# Patient Record
Sex: Female | Born: 1980 | Race: White | Hispanic: No | Smoking: Former smoker
Health system: Southern US, Community
[De-identification: ages and names within clinical notes are randomized; demographics above are authoritative.]

## PROBLEM LIST (undated history)

## (undated) HISTORY — PX: TONSILLECTOMY AND ADENOIDECTOMY: SHX28

---

## 2001-03-05 ENCOUNTER — Encounter: Payer: Self-pay | Admitting: Family Medicine

## 2001-03-05 ENCOUNTER — Encounter: Admission: RE | Admit: 2001-03-05 | Discharge: 2001-03-05 | Payer: Self-pay | Admitting: Family Medicine

## 2011-03-23 ENCOUNTER — Other Ambulatory Visit: Payer: Self-pay | Admitting: Occupational Medicine

## 2011-03-23 ENCOUNTER — Ambulatory Visit: Payer: Self-pay

## 2011-03-23 DIAGNOSIS — R52 Pain, unspecified: Secondary | ICD-10-CM

## 2012-07-31 IMAGING — CR DG ELBOW COMPLETE 3+V*L*
4 series · 4 of 4 positions shown · non-contrast
Comparison: None.

CLINICAL DATA: 29-year-old female status post trauma with pain.

LEFT ELBOW - COMPLETE 3+ VIEW

[view not recorded (1 of 4)]
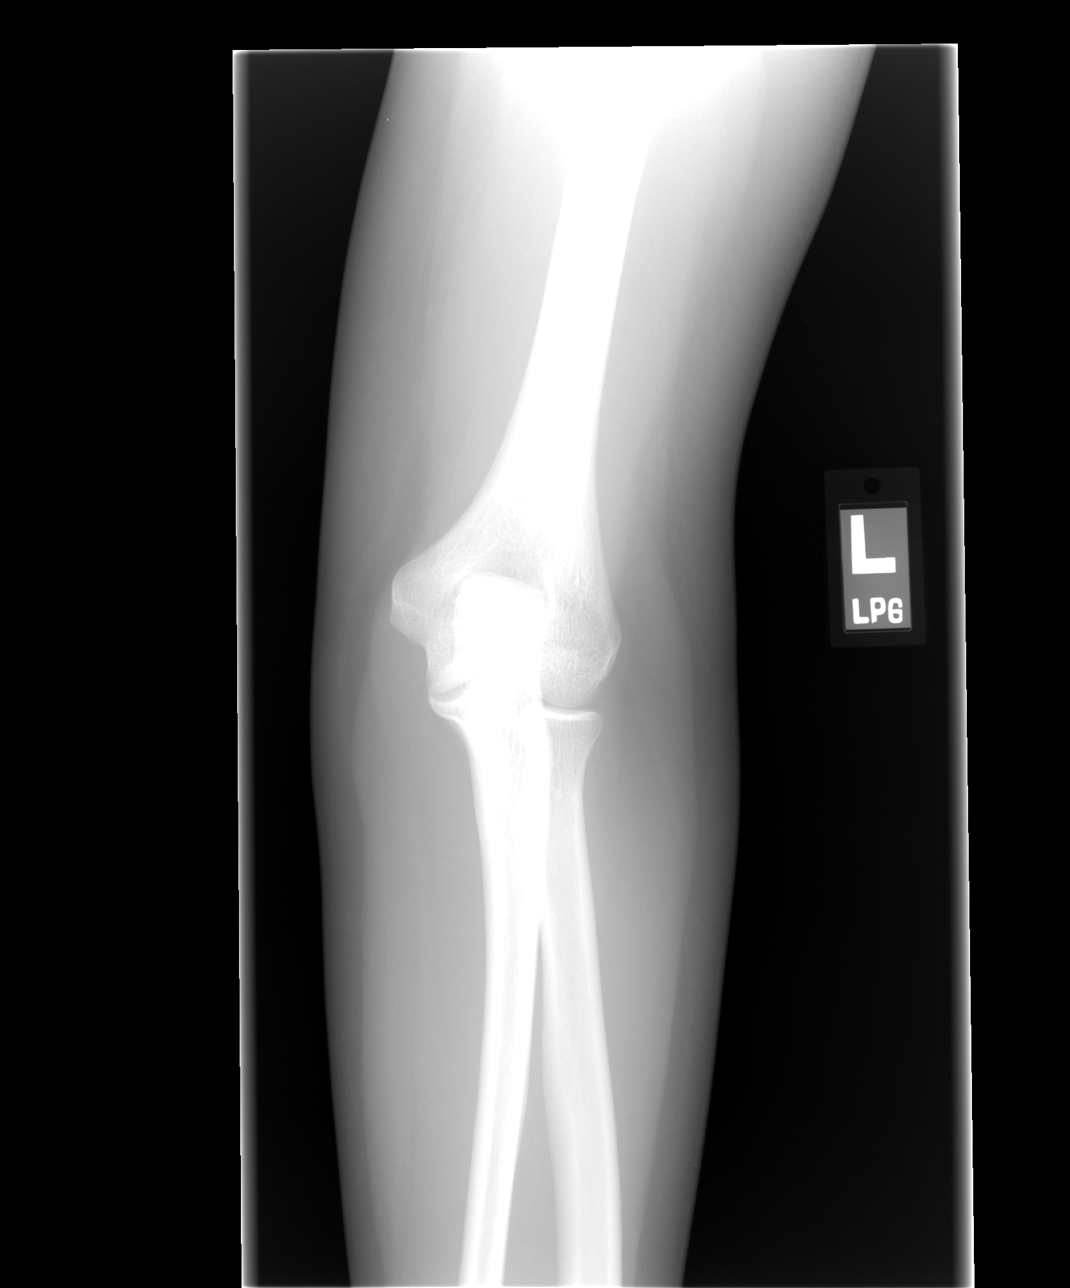

[view not recorded (2 of 4)]
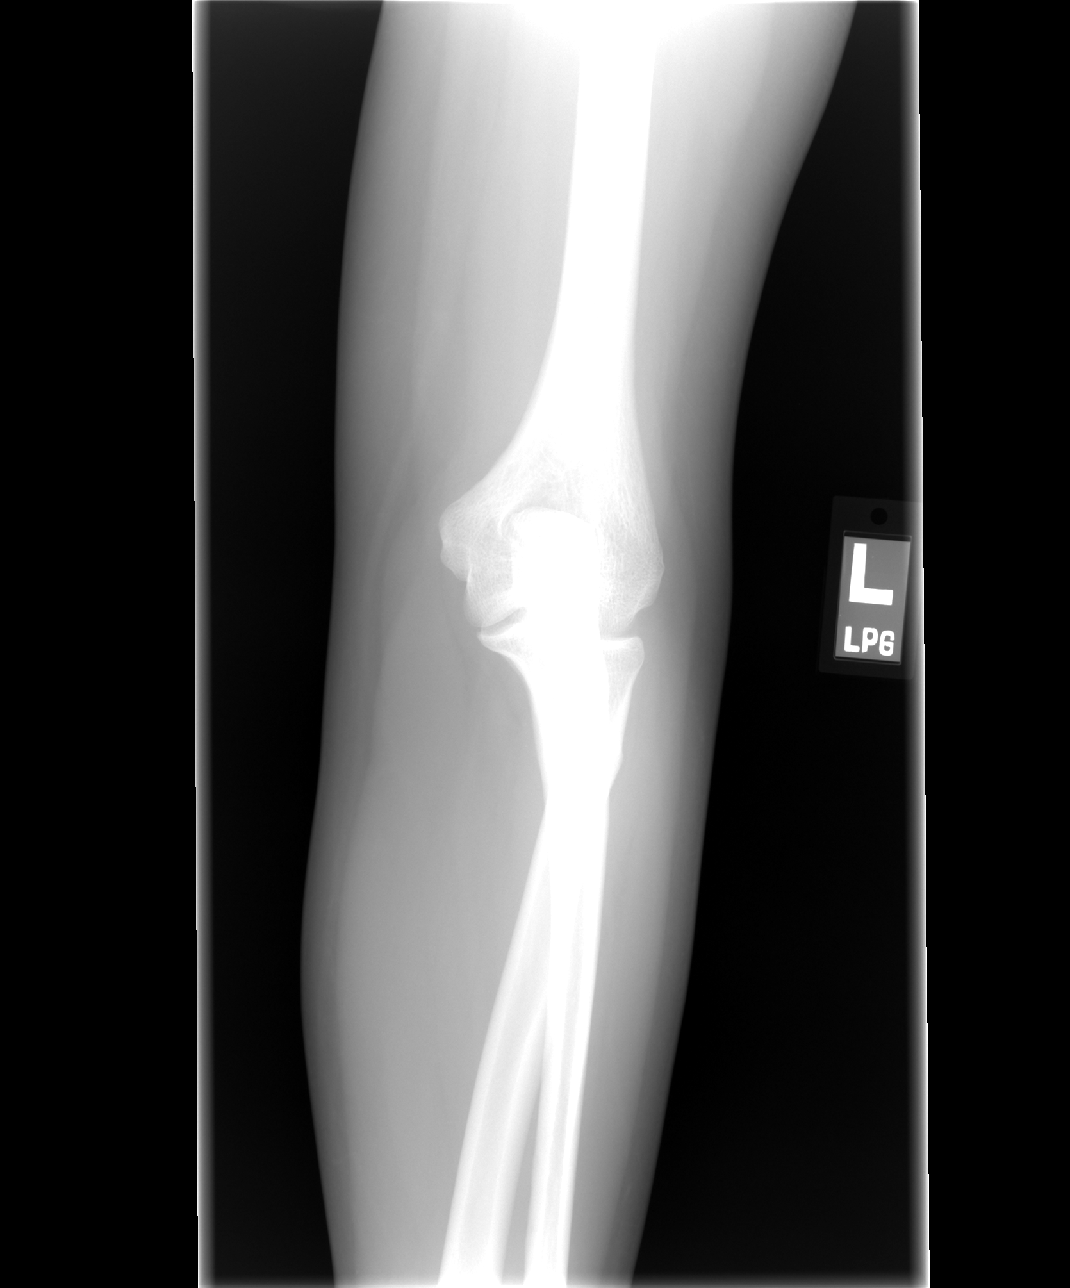

[view not recorded (3 of 4)]
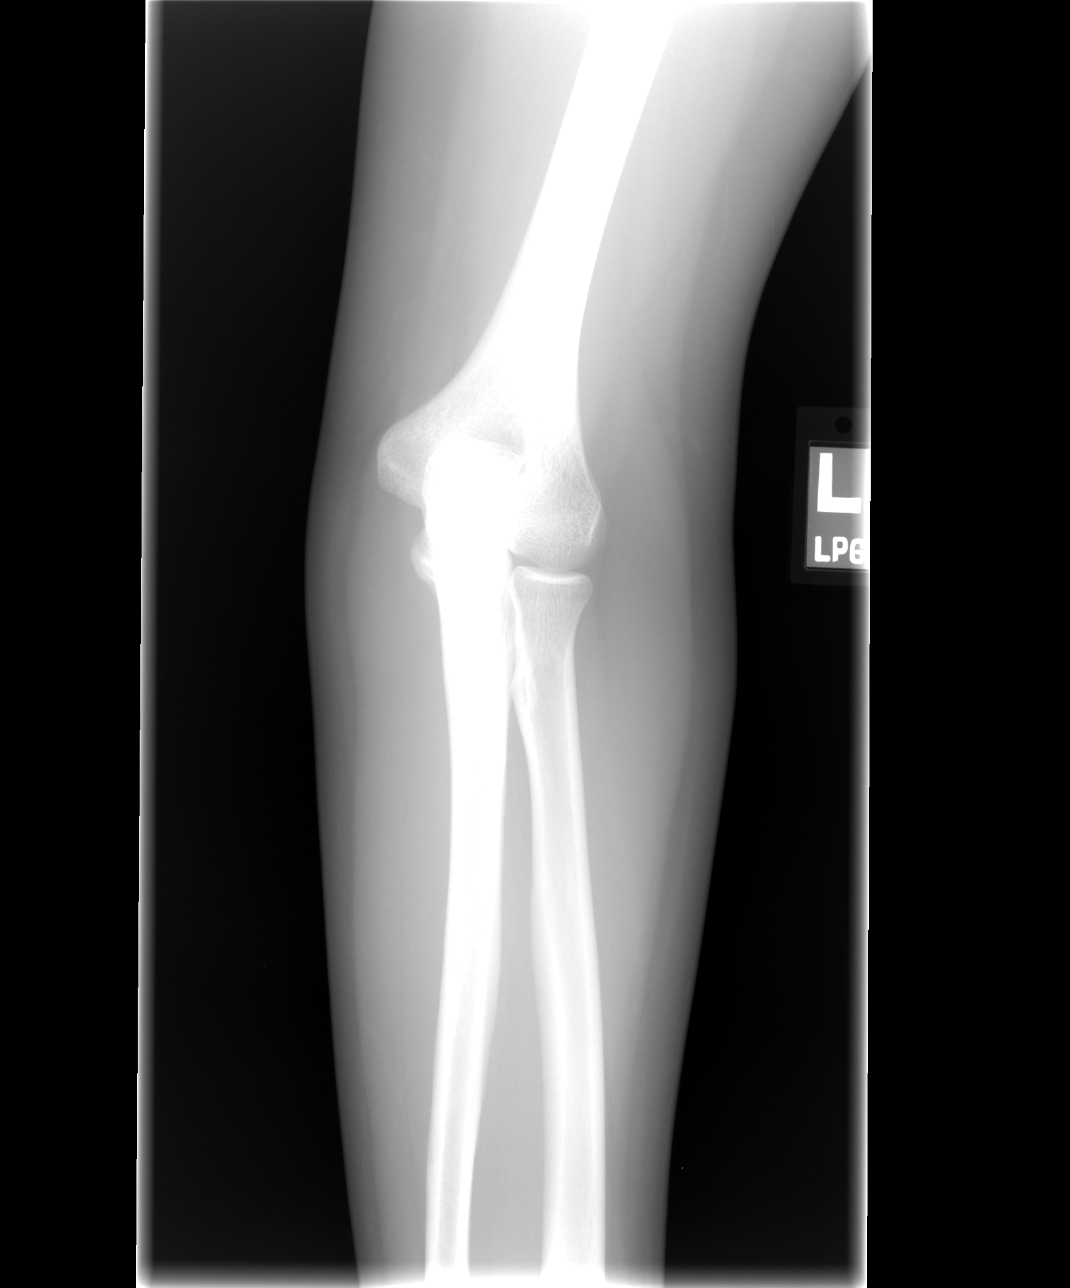

[view not recorded (4 of 4)]
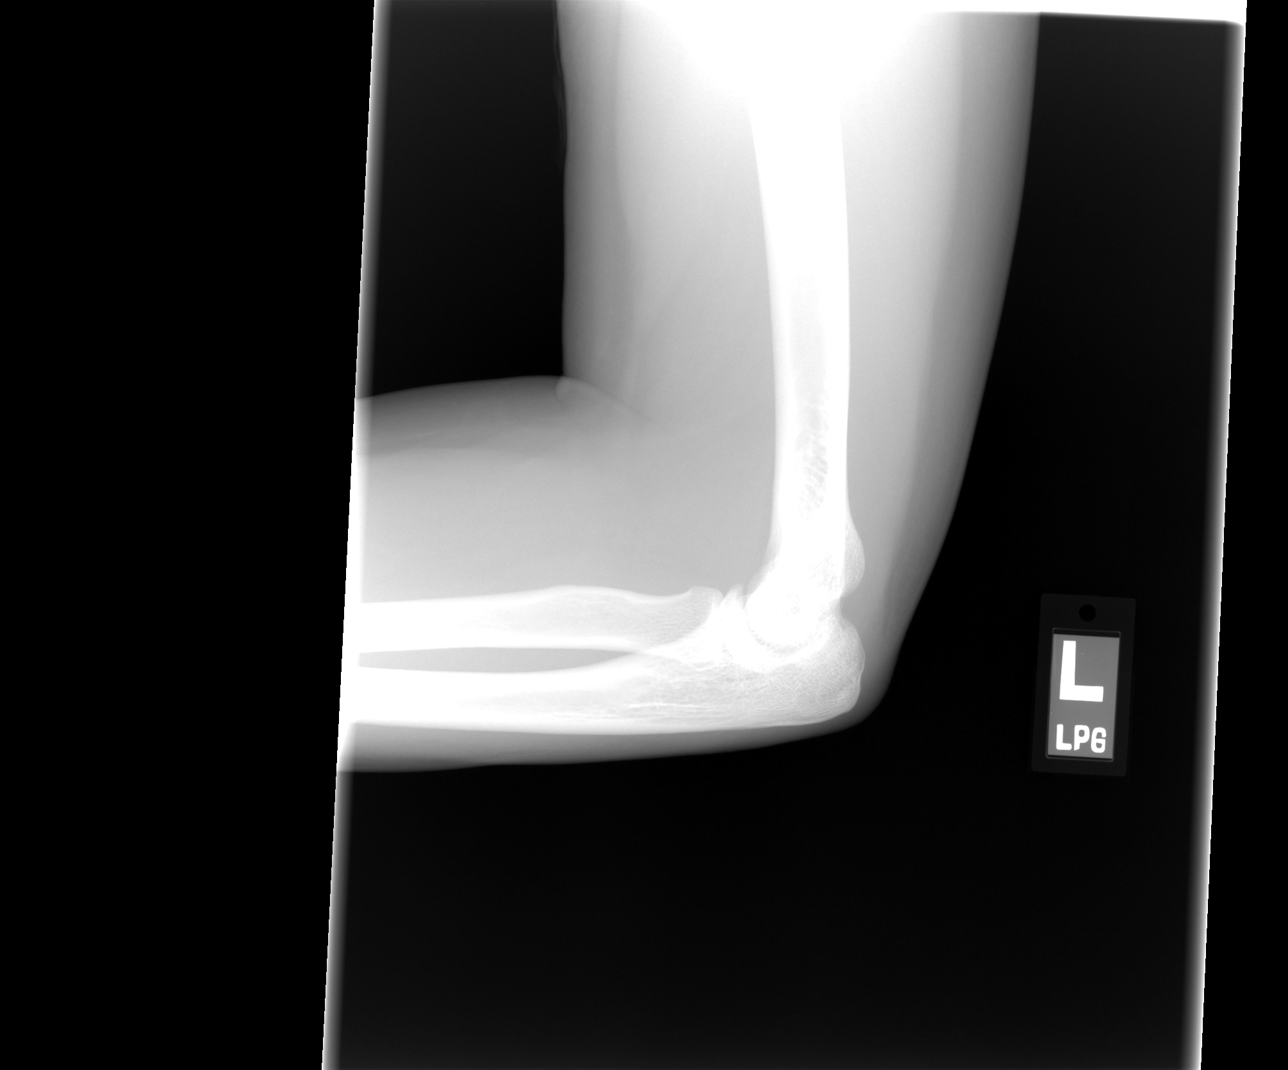

[4 of 4 positions shown; findings below may reference images not displayed]

FINDINGS: No evidence of joint effusion. Bone mineralization is
within normal limits.  Normal alignment and joint spaces.  Radial
head within normal limits.  No acute fracture.
IMPRESSION: No acute fracture or dislocation identified about the left elbow.

## 2014-01-21 ENCOUNTER — Ambulatory Visit: Payer: Self-pay | Admitting: Physician Assistant

## 2014-01-21 VITALS — BP 110/62 | HR 58 | Temp 98.0°F | Resp 18 | Ht 64.0 in | Wt 200.0 lb

## 2014-01-21 DIAGNOSIS — Z0289 Encounter for other administrative examinations: Secondary | ICD-10-CM

## 2014-01-21 NOTE — Progress Notes (Signed)
ua-sp gr-1.010,protein-neg, glucose-neg, blood-neg

## 2014-01-21 NOTE — Progress Notes (Signed)
   Subjective:    Patient ID: Marilyn Webb, female    DOB: 1981/10/06, 33 y.o.   MRN: [id number]  HPI   Ms. 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  She reports that she is in good health.  Denies medical problems.  Denies medication use - prescription or otc.  No recent surgeries.  No seizure or syncope.  She does wear corrective lenses when driving.  Review of Systems  Constitutional: Negative.   HENT: Negative.   Respiratory: Negative.   Cardiovascular: Negative.   Musculoskeletal: Negative.        Objective:   Physical Exam  Vitals reviewed. Constitutional: She is oriented to person, place, and time. She appears well-developed and well-nourished. No distress.  HENT:  Head: Normocephalic and atraumatic.  Right Ear: Tympanic membrane and ear canal normal.  Left Ear: Tympanic membrane and ear canal normal.  Mouth/Throat: Uvula is midline, oropharynx is clear and moist and mucous membranes are normal.  Eyes: Conjunctivae and EOM are normal. Pupils are equal, round, and reactive to light. No scleral icterus.  Neck: Normal range of motion. Neck supple.  Cardiovascular: Normal rate, regular rhythm, normal heart sounds and intact distal pulses.   No murmur heard. Pulmonary/Chest: Effort normal and breath sounds normal. She has no wheezes. She has no rales.  Abdominal: Soft. Bowel sounds are normal. There is no tenderness.  Musculoskeletal: Normal range of motion. She exhibits no edema.  Lymphadenopathy:    She has no cervical adenopathy.  Neurological: She is alert and oriented to person, place, and time. She has normal reflexes.  Skin: Skin is warm and dry.  Psychiatric: She has a normal mood and affect. Her behavior is normal.        Assessment & Plan:  Health examination of defined subpopulation   Ms. 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  She appears to be in good health and exam is normal.  She does wear glasses.  Ok to certify for 2  yrs.  Card and ppw completed  RTC as needs arise.    Loleta DickerE. Elizabeth Eliot Bencivenga MHS, PA-C [facility] 3/12/20153:26 PM

## 2024-03-02 ENCOUNTER — Emergency Department (HOSPITAL_BASED_OUTPATIENT_CLINIC_OR_DEPARTMENT_OTHER)

## 2024-03-02 ENCOUNTER — Encounter (HOSPITAL_BASED_OUTPATIENT_CLINIC_OR_DEPARTMENT_OTHER): Payer: Self-pay | Admitting: Emergency Medicine

## 2024-03-02 ENCOUNTER — Other Ambulatory Visit: Payer: Self-pay

## 2024-03-02 ENCOUNTER — Emergency Department (HOSPITAL_BASED_OUTPATIENT_CLINIC_OR_DEPARTMENT_OTHER)
Admission: EM | Admit: 2024-03-02 | Discharge: 2024-03-02 | Disposition: A | Discharge status: Home / Self Care | Attending: Emergency Medicine | Admitting: Emergency Medicine

## 2024-03-02 DIAGNOSIS — M542 Cervicalgia: Secondary | ICD-10-CM | POA: Insufficient documentation

## 2024-03-02 DIAGNOSIS — D72829 Elevated white blood cell count, unspecified: Secondary | ICD-10-CM | POA: Insufficient documentation

## 2024-03-02 DIAGNOSIS — R519 Headache, unspecified: Secondary | ICD-10-CM | POA: Insufficient documentation

## 2024-03-02 LAB — CBC
HCT: 43.9 % (ref 36.0–46.0)
Hemoglobin: 15.1 g/dL — ABNORMAL HIGH (ref 12.0–15.0)
MCH: 30.3 pg (ref 26.0–34.0)
MCHC: 34.4 g/dL (ref 30.0–36.0)
MCV: 88 fL (ref 80.0–100.0)
Platelets: 391 10*3/uL (ref 150–400)
RBC: 4.99 MIL/uL (ref 3.87–5.11)
RDW: 13.2 % (ref 11.5–15.5)
WBC: 13.2 10*3/uL — ABNORMAL HIGH (ref 4.0–10.5)
nRBC: 0 % (ref 0.0–0.2)

## 2024-03-02 LAB — PREGNANCY, URINE: Preg Test, Ur: NEGATIVE

## 2024-03-02 LAB — BASIC METABOLIC PANEL WITH GFR
Anion gap: 9 (ref 5–15)
BUN: 13 mg/dL (ref 6–20)
CO2: 28 mmol/L (ref 22–32)
Calcium: 9.2 mg/dL (ref 8.9–10.3)
Chloride: 101 mmol/L (ref 98–111)
Creatinine, Ser: 0.75 mg/dL (ref 0.44–1.00)
GFR, Estimated: >60 mL/min (ref >60)
Glucose, Bld: 105 mg/dL — ABNORMAL HIGH (ref 70–99)
Potassium: 3.8 mmol/L (ref 3.5–5.1)
Sodium: 138 mmol/L (ref 135–145)

## 2024-03-02 MED ORDER — DIPHENHYDRAMINE HCL 50 MG/ML IJ SOLN
12.5000 mg | Freq: Once | INTRAMUSCULAR | Status: AC
  Administered 2024-03-02: 12.5 mg via INTRAVENOUS
  Filled 2024-03-02: qty 1

## 2024-03-02 MED ORDER — KETOROLAC TROMETHAMINE 15 MG/ML IJ SOLN
15.0000 mg | Freq: Once | INTRAMUSCULAR | Status: AC
  Administered 2024-03-02: 15 mg via INTRAVENOUS
  Filled 2024-03-02: qty 1

## 2024-03-02 MED ORDER — PROCHLORPERAZINE EDISYLATE 10 MG/2ML IJ SOLN
10.0000 mg | Freq: Once | INTRAMUSCULAR | Status: AC
  Administered 2024-03-02: 10 mg via INTRAVENOUS
  Filled 2024-03-02: qty 2

## 2024-03-02 NOTE — ED Triage Notes (Signed)
 Persistent Neck pain radiating to base of her head x 3 weeks , steroids and muscle relaxer with no relief , CT of neck done last week with no remarkable results . Intact ROM .

## 2024-03-02 NOTE — Discharge Instructions (Signed)
 Your workup was overall reassuring today.  Please follow-up with primary care doctor.  Did also recommend calling your neurologist to schedule follow-up for today's visit.  Return to ER with new or worsening symptoms.

## 2024-03-02 NOTE — ED Provider Notes (Signed)
  EMERGENCY DEPARTMENT AT [facility] Provider Note   CSN: [id number] Arrival date & time: 03/02/24  1148     History  Chief Complaint  Patient presents with   Neck Pain    Marilyn Webb is a 43 y.o. female. With past medical history of IIH, headaches reporting to ER after 2 weeks of neck pain. Patient reports that she tried steroids, muscle relaxer and pain medication and this has helped by now she has posterior HA that will not go away. Headache seems to be better laying down flat. Seems to get worse throughout the day. She recently have CT cervical spine without acute findings, but she reports it is more neck pain.  NO fever, chills. NO URI like symptoms. No injury or fall. No neck rigidity.     Neck Pain Associated symptoms: headaches        Home Medications Prior to Admission medications   Not on File      Allergies    Patient has no known allergies.    Review of Systems   Review of Systems  Neurological:  Positive for headaches.    Physical Exam Updated Vital Signs BP 130/79   Pulse 83   Temp 97.8 F (36.6 C) (Oral)   Resp 18   Wt 106.6 kg   SpO2 100%  Physical Exam Vitals and nursing note reviewed.  Constitutional:      General: She is not in acute distress.    Appearance: She is not toxic-appearing.  HENT:     Head: Normocephalic and atraumatic.  Eyes:     General: No scleral icterus.    Conjunctiva/sclera: Conjunctivae normal.  Cardiovascular:     Rate and Rhythm: Normal rate and regular rhythm.     Pulses: Normal pulses.     Heart sounds: Normal heart sounds.  Pulmonary:     Effort: Pulmonary effort is normal. No respiratory distress.     Breath sounds: Normal breath sounds.  Abdominal:     General: Abdomen is flat. Bowel sounds are normal.     Palpations: Abdomen is soft.     Tenderness: There is no abdominal tenderness.  Musculoskeletal:     Right lower leg: No edema.     Left lower leg: No edema.  Skin:     General: Skin is warm and dry.     Findings: No lesion.  Neurological:     General: No focal deficit present.     Mental Status: She is alert and oriented to person, place, and time. Mental status is at baseline.     ED Results / Procedures / Treatments   Labs (all labs ordered are listed, but only abnormal results are displayed) Labs Reviewed  CBC - Abnormal; Notable for the following components:      Result Value   WBC 13.2 (*)    Hemoglobin 15.1 (*)    All other components within normal limits  BASIC METABOLIC PANEL WITH GFR - Abnormal; Notable for the following components:   Glucose, Bld 105 (*)    All other components within normal limits  PREGNANCY, URINE    EKG None  Radiology No results found.  Procedures Procedures    Medications Ordered in ED Medications - No data to display  ED Course/ Medical Decision Making/ A&P                                 Medical  Decision Making Amount and/or Complexity of Data Reviewed Labs: ordered. Radiology: ordered.  Risk Prescription drug management.   This patient presents to the ED for concern of headache, this involves an extensive number of treatment options, and is a complaint that carries with it a high risk of complications and morbidity.  The differential diagnosis includes CVA, migraine, HA, meningitis, encephalitis, intracranial mass, trauma    Co morbidities that complicate the patient evaluation  IIH   Additional history obtained:  Additional history obtained from 02/24/24 OV and CT cervical spine imaging.    Lab Tests:  I personally interpreted labs.  The pertinent results include:   With mild leukocytosis which is suspected secondary to steroid use.  No significant anemia.  No significant electrolyte abnormality   Imaging Studies ordered:  I ordered imaging studies including head CT  I independently visualized and interpreted imaging which showed no acute findings.  I agree with the  radiologist interpretation   Cardiac Monitoring: / EKG:  The patient was maintained on a cardiac monitor.       Problem List / ED Course / Critical interventions / Medication management  Patient reporting to emergency room headache.  She is been dealing with posterior head and neck pain for the past 2 weeks.  She has had some improvement with steroids and Flexeril however she continues to have this headache. Headache was not sudden onset or severe. No meningeal signs Denies any injury trauma or fall. No fever. Denies any focal deficits and no focal deficits on exam.  She is hemodynamically stable and well-appearing. Ambulatory with steady gait.  She denies any chest pain or shortness of breath.  Denies abdominal pain. Given history of intracranial hypertension, will check labs, imaging and follow up after medications.  Patient feeling better after migraine cocktail. Has appropriate follow up. Stable for discharge.   I ordered medication including migraine cocktail.  Reevaluation of the patient after these medicines showed that the patient improved I have reviewed the patients home medicines and have made adjustments as needed   Plan  F/u w/ PCP in 2-3d to ensure resolution of sx.  Patient was given return precautions. Patient stable for discharge at this time.  Patient educated on sx/dx and verbalized understanding of plan. Return to ER w/ new or worsening sx.          Final Clinical Impression(s) / ED Diagnoses Final diagnoses:  Bad headache    Rx / DC Orders ED Discharge Orders     None         Suzanne Erps Allison Ivory 03/02/24 1555    Ninetta Basket, MD 03/08/24 [phone]
